# Patient Record
Sex: Male | Born: 1970 | Race: Black or African American | Hispanic: No | Smoking: Never smoker
Health system: Southern US, Community
[De-identification: ages and names within clinical notes are randomized; demographics above are authoritative.]

## PROBLEM LIST (undated history)

## (undated) ENCOUNTER — Ambulatory Visit: Admission: EM | Payer: Self-pay

---

## 2021-06-25 ENCOUNTER — Other Ambulatory Visit: Payer: Self-pay

## 2021-06-25 ENCOUNTER — Encounter: Payer: Self-pay | Admitting: Internal Medicine

## 2021-06-25 ENCOUNTER — Ambulatory Visit
Admission: EM | Admit: 2021-06-25 | Discharge: 2021-06-25 | Disposition: A | Discharge status: Home / Self Care | Payer: Self-pay | Attending: Internal Medicine | Admitting: Internal Medicine

## 2021-06-25 DIAGNOSIS — L0291 Cutaneous abscess, unspecified: Secondary | ICD-10-CM

## 2021-06-25 MED ORDER — DOXYCYCLINE HYCLATE 100 MG PO CAPS: 100.0000 mg | ORAL_CAPSULE | Freq: Two times a day (BID) | ORAL | 0 refills | Status: DC

## 2021-06-25 NOTE — ED Provider Notes (Signed)
[facility]    CSN: [id number] Arrival date & time: 06/25/21  0803      History   Chief Complaint Chief Complaint  Patient presents with   Abscess    HPI Juan Krause is a 51 y.o. male who presents with onset of abscesses starting about one week ago. Has them on both axilla, R groin, lower abdomen and back of his neck which appeared this am. He has never had this before. He tried to drain the one of his abdomen, but nothing came out. This area is the most tender. As far as he knows his male sexual partner of 1 y, does not have  hx of abscesses.      History reviewed. No pertinent past medical history.  There are no problems to display for this patient.   History reviewed. No pertinent surgical history.     Home Medications    Prior to Admission medications   Medication Sig Start Date End Date Taking? Authorizing Provider  doxycycline (VIBRAMYCIN) 100 MG capsule Take 1 capsule (100 mg total) by mouth 2 (two) times daily. 06/25/21  Yes Rodriguez-Southworth, Viviana Simpler    Family History History reviewed. No pertinent family history.  Social History Social History   Tobacco Use   Smoking status: Never   Smokeless tobacco: Never  Vaping Use   Vaping Use: Never used  Substance Use Topics   Alcohol use: Not Currently   Drug use: Not Currently     Allergies   Patient has no known allergies.   Review of Systems Review of Systems  Constitutional:  Negative for fever.  Musculoskeletal:  Negative for myalgias.  Skin:  Negative for rash and wound.       Lumps on skin    Physical Exam Triage Vital Signs ED Triage Vitals  Enc Vitals Group     BP 06/25/21 0818 137/86     Pulse Rate 06/25/21 0818 75     Resp 06/25/21 0818 18     Temp 06/25/21 0818 98.2 F (36.8 C)     Temp Source 06/25/21 0818 Oral     SpO2 06/25/21 0818 98 %     Weight 06/25/21 0816 245 lb (111.1 kg)     Height 06/25/21 0816 6' (1.829 m)     Head Circumference --       Peak Flow --      Pain Score 06/25/21 0816 4     Pain Loc --      Pain Edu? --      Excl. in GC? --    No data found.  Updated Vital Signs BP 137/86 (BP Location: Right Arm)    Pulse 75    Temp 98.2 F (36.8 C) (Oral)    Resp 18    Ht 6' (1.829 m)    Wt 245 lb (111.1 kg)    SpO2 98%    BMI 33.23 kg/m   Visual Acuity Right Eye Distance:   Left Eye Distance:   Bilateral Distance:    Right Eye Near:   Left Eye Near:    Bilateral Near:     Physical Exam Vitals and nursing note reviewed.  Constitutional:      General: He is not in acute distress.    Appearance: He is obese. He is not toxic-appearing.  HENT:     Right Ear: External ear normal.     Left Ear: External ear normal.  Eyes:     General: No scleral icterus.  Conjunctiva/sclera: Conjunctivae normal.  Neck:     Comments: Has induration of about 1/2 cm x 1/2 on central posterior neck. Skin is not red, and area is not tender Pulmonary:     Effort: Pulmonary effort is normal.  Musculoskeletal:     Cervical back: Neck supple.  Skin:    General: Skin is warm and dry.     Findings: No bruising.     Comments: Has indurations in following areas: 1/2 cm x 1/2 cm induration on R axilla, L axilla and central suprapubic region. 1.5 cm x 1.5 cm x 1.5 cm on R upper inner thigh 3 cm x 3 cm on central lower abdomen below umbilicus.   Neurological:     Mental Status: He is alert and oriented to person, place, and time.     Gait: Gait normal.  Psychiatric:        Mood and Affect: Mood normal.        Behavior: Behavior normal.        Thought Content: Thought content normal.        Judgment: Judgment normal.     UC Treatments / Results  Labs (all labs ordered are listed, but only abnormal results are displayed) Labs Reviewed - No data to display  EKG   Radiology No results found.  Procedures Procedures (including critical care time)  Medications Ordered in UC Medications - No data to display  Initial  Impression / Assessment and Plan / UC Course  I have reviewed the triage vital signs and the nursing notes. Abscesses None of them are ready for I&D I placed him on Doxy as noted. See instructions.      Final Clinical Impressions(s) / UC Diagnoses   Final diagnoses:  Abscess     Discharge Instructions      Use heat on those tender areas for 15 minutes, 2-4 times a day  Come back if any of those gets larger and more tender, in case we need to drain it here     ED Prescriptions     Medication Sig Dispense Auth. Provider   doxycycline (VIBRAMYCIN) 100 MG capsule Take 1 capsule (100 mg total) by mouth 2 (two) times daily. 20 capsule Rodriguez-Southworth, Nettie Elm, PA-C      PDMP not reviewed this encounter.   Garey Ham, PA-C 06/25/21 0845

## 2021-06-25 NOTE — Discharge Instructions (Addendum)
Use heat on those tender areas for 15 minutes, 2-4 times a day  Come back if any of those gets larger and more tender, in case we need to drain it here

## 2021-06-25 NOTE — ED Triage Notes (Signed)
Pt has abscesses on bilateral axilla, right groin, lower abdomen and the back of his neck. The first one started about a week ago. He has been using neosporin.

## 2021-08-10 ENCOUNTER — Ambulatory Visit
Admission: EM | Admit: 2021-08-10 | Discharge: 2021-08-10 | Disposition: A | Discharge status: Home / Self Care | Payer: Self-pay | Attending: Physician Assistant | Admitting: Physician Assistant

## 2021-08-10 ENCOUNTER — Other Ambulatory Visit: Payer: Self-pay

## 2021-08-10 DIAGNOSIS — R21 Rash and other nonspecific skin eruption: Secondary | ICD-10-CM

## 2021-08-10 DIAGNOSIS — S39012A Strain of muscle, fascia and tendon of lower back, initial encounter: Secondary | ICD-10-CM

## 2021-08-10 MED ORDER — NAPROXEN 500 MG PO TABS: 500.0000 mg | ORAL_TABLET | Freq: Two times a day (BID) | ORAL | 0 refills | Status: AC

## 2021-08-10 MED ORDER — CLOTRIMAZOLE 1 % EX CREA: TOPICAL_CREAM | CUTANEOUS | 0 refills | Status: AC

## 2021-08-10 MED ORDER — CYCLOBENZAPRINE HCL 10 MG PO TABS: 10.0000 mg | ORAL_TABLET | Freq: Three times a day (TID) | ORAL | 0 refills | Status: AC | PRN

## 2021-08-10 NOTE — ED Triage Notes (Signed)
Pt is here with C/O rash from wearing new uniforms, pt also here with C/O right lower back pain, was working on Monday and bent over and felt something pop has felt like a pull since.  ?

## 2021-08-10 NOTE — Discharge Instructions (Addendum)

## 2021-08-10 NOTE — ED Provider Notes (Signed)
?[facility] ? ? ? ?CSN: [id number] ?Arrival date & time: 08/10/21  1309 ? ? ?  ? ?History   ?Chief Complaint ?Chief Complaint  ?Patient presents with  ? Back Pain  ? Rash  ? ? ?HPI ?Juan Krause is a 51 y.o. male presenting for 2 separate complaints.  First patient states he has had an itchy dry rash in his groin area, lower back and bilateral axillary regions for the past couple of weeks.  He has applied hydrocortisone cream which she says helps the itch but the rash has not gone away.  He believes it may be due to "polyester uniforms" at work.  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 ? ?Patient also presenting for lower back pain x2 days.  Patient states he was bending over to pick something up at work and felt a pop in his back.  He says he is at increased pain when he turns or twists anyway ever since.  Pain does not radiate.  It is aching/cramping in nature.  No numbness, tingling or weakness.  No bowel or bladder incontinence.  Denies similar problems the past.  Has not taken any medication over-the-counter because he says he did not know what to take.  No other concerns. ? ?HPI ? ?History reviewed. No pertinent past medical history. ? ?There are no problems to display for this patient. ? ? ?History reviewed. No pertinent surgical history. ? ? ? ? ?Home Medications   ? ?Prior to Admission medications   ?Medication Sig Start Date End Date Taking? Authorizing Provider  ?clotrimazole (LOTRIMIN) 1 % cream Apply to affected area 2 times daily 08/10/21 08/24/21 Yes Eusebio FriendlyEaves, Rodger Giangregorio B, PA-C  ?cyclobenzaprine (FLEXERIL) 10 MG tablet Take 1 tablet (10 mg total) by mouth 3 (three) times daily as needed for up to 7 days for muscle spasms. 08/10/21 08/17/21 Yes Eusebio FriendlyEaves, Lemma Tetro B, PA-C  ?naproxen (NAPROSYN) 500 MG tablet Take 1 tablet (500 mg total) by mouth 2 (two) times daily for 10 days. 08/10/21 08/20/21 Yes Shirlee LatchEaves, Azlynn Mitnick B, PA-C   ? ? ?Family History ?History reviewed. No pertinent family history. ? ?Social History ?Social History  ? ?Tobacco Use  ? Smoking status: Never  ? Smokeless tobacco: Never  ?Vaping Use  ? Vaping Use: Never used  ?Substance Use Topics  ? Alcohol use: Not Currently  ? Drug use: Not Currently  ? ? ? ?Allergies   ?Patient has no known allergies. ? ? ?Review of Systems ?Review of Systems  ?Constitutional:  Negative for fatigue and fever.  ?Gastrointestinal:  Negative for abdominal pain.  ?Genitourinary:  Negative for dysuria, flank pain and penile discharge.  ?Musculoskeletal:  Positive for back pain. Negative for arthralgias and gait problem.  ?Skin:  Positive for rash.  ?Neurological:  Negative for weakness and numbness.  ? ? ?Physical Exam ?Triage Vital Signs ?ED Triage Vitals  ?Enc Vitals Group  ?   BP   ?   Pulse   ?   Resp   ?   Temp   ?   Temp src   ?   SpO2   ?   Weight   ?   Height   ?   Head Circumference   ?   Peak Flow   ?   Pain Score   ?   Pain Loc   ?   Pain Edu?   ?  Excl. in GC?   ? ?No data found. ? ?Updated Vital Signs ?BP 140/90 (BP Location: Left Arm)   Pulse 66   Temp 98.5 ?F (36.9 ?C) (Oral)   Resp 18   Ht 6' (1.829 m)   Wt 245 lb (111.1 kg)   SpO2 99%   BMI 33.23 kg/m?  ?   ? ?Physical Exam ?Vitals and nursing note reviewed.  ?Constitutional:   ?   General: He is not in acute distress. ?   Appearance: Normal appearance. He is well-developed. He is not ill-appearing.  ?HENT:  ?   Head: Normocephalic and atraumatic.  ?Eyes:  ?   General: No scleral icterus. ?   Conjunctiva/sclera: Conjunctivae normal.  ?Cardiovascular:  ?   Rate and Rhythm: Normal rate and regular rhythm.  ?   Heart sounds: Normal heart sounds.  ?Pulmonary:  ?   Effort: Pulmonary effort is normal. No respiratory distress.  ?   Breath sounds: Normal breath sounds.  ?Musculoskeletal:  ?   Cervical back: Neck supple.  ?   Lumbar back: Tenderness (diffuse TTP bilateral lumbar musculature) present. Decreased range of motion.  Negative right straight leg raise test and negative left straight leg raise test.  ?Skin: ?   General: Skin is warm and dry.  ?   Capillary Refill: Capillary refill takes less than 2 seconds.  ?   Findings: Rash present.  ?   Comments: Dry hyperpigmented rash of lower back, bilateral inguinal regions and bilateral axillary regions.  ?Neurological:  ?   General: No focal deficit present.  ?   Mental Status: He is alert. Mental status is at baseline.  ?   Motor: No weakness.  ?   Coordination: Coordination normal.  ?Psychiatric:     ?   Mood and Affect: Mood normal.     ?   Behavior: Behavior normal.     ?   Thought Content: Thought content normal.  ? ? ? ?UC Treatments / Results  ?Labs ?(all labs ordered are listed, but only abnormal results are displayed) ?Labs Reviewed - No data to display ? ?EKG ? ? ?Radiology ?No results found. ? ?Procedures ?Procedures (including critical care time) ? ?Medications Ordered in UC ?Medications - No data to display ? ?Initial Impression / Assessment and Plan / UC Course  ?I have reviewed the triage vital signs and the nursing notes. ? ?Pertinent labs & imaging results that were available during my care of the patient were reviewed by me and considered in my medical decision making (see chart for details). ? ?51 year old male presenting for 2 separate complaints.  First patient presenting with pruritic rash of groin, lower back and bilateral axillary regions.  Rash is consistent with suspected fungal type rash.  Send clotrimazole to pharmacy.  Advised him not to apply any more hydrocortisone as it is likely making it worse. ? ?Patient also presenting for work-related back pain following an injury.  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  He says he wanted to be checked out first.  Pain is diffusely across the lumbar region.  It does not radiate.  It is not associated with any red flag signs or symptoms.  It is consistent with lumbar strain/sprain.  Patient given  naproxen and cyclobenzaprine.  Reviewed ED red flags relating to back pain with patient.  Work note given. ? ? ?Final Clinical Impressions(s) / UC Diagnoses  ? ?Final diagnoses:  ?Strain of lumbar region, initial encounter  ?Rash  ? ? ? ?  Discharge Instructions   ? ?  ?BACK PAIN: Stressed avoiding painful activities . RICE (REST, ICE, COMPRESSION, ELEVATION) guidelines reviewed. May alternate ice and heat. Consider use of muscle rubs, Salonpas patches, etc. Use medications as directed including muscle relaxers if prescribed. Take anti-inflammatory medications as prescribed or OTC NSAIDs/Tylenol.  F/u with PCP in 7-10 days for reexamination, and please feel free to call or return to the urgent care at any time for any questions or concerns you may have and we will be happy to help you!  ? ?BACK PAIN RED FLAGS: If the back pain acutely worsens or there are any red flag symptoms such as numbness/tingling, leg weakness, saddle anesthesia, or loss of bowel/bladder control, go immediately to the ER. Follow up with [place] as scheduled or sooner if the pain does not begin to resolve or if it worsens before the follow up   ? ? ? ? ?ED Prescriptions   ? ? Medication Sig Dispense Auth. Provider  ? clotrimazole (LOTRIMIN) 1 % cream Apply to affected area 2 times daily 45 g Eusebio Friendly B, PA-C  ? naproxen (NAPROSYN) 500 MG tablet Take 1 tablet (500 mg total) by mouth 2 (two) times daily for 10 days. 20 tablet Eusebio Friendly B, PA-C  ? cyclobenzaprine (FLEXERIL) 10 MG tablet Take 1 tablet (10 mg total) by mouth 3 (three) times daily as needed for up to 7 days for muscle spasms. 15 tablet Shirlee Latch, PA-C  ? ?  ? ?PDMP not reviewed this encounter. ?  ?Shirlee Latch, PA-C ?08/10/21 1504 ? ?

## 2021-08-16 ENCOUNTER — Other Ambulatory Visit: Payer: Self-pay

## 2021-08-16 ENCOUNTER — Emergency Department
Admission: EM | Admit: 2021-08-16 | Discharge: 2021-08-16 | Disposition: A | Discharge status: Home / Self Care | Payer: Worker's Compensation | Attending: Emergency Medicine | Admitting: Emergency Medicine

## 2021-08-16 ENCOUNTER — Emergency Department: Payer: Worker's Compensation

## 2021-08-16 DIAGNOSIS — M545 Low back pain, unspecified: Secondary | ICD-10-CM | POA: Diagnosis present

## 2021-08-16 DIAGNOSIS — M5432 Sciatica, left side: Secondary | ICD-10-CM | POA: Insufficient documentation

## 2021-08-16 DIAGNOSIS — M5441 Lumbago with sciatica, right side: Secondary | ICD-10-CM

## 2021-08-16 DIAGNOSIS — M5431 Sciatica, right side: Secondary | ICD-10-CM | POA: Insufficient documentation

## 2021-08-16 MED ORDER — KETOROLAC TROMETHAMINE 30 MG/ML IJ SOLN
30.0000 mg | Freq: Once | INTRAMUSCULAR | Status: DC
  Filled 2021-08-16: qty 1

## 2021-08-16 MED ORDER — KETOROLAC TROMETHAMINE 30 MG/ML IJ SOLN
30.0000 mg | Freq: Once | INTRAMUSCULAR | Status: AC
  Administered 2021-08-16: 30 mg via INTRAMUSCULAR
  Filled 2021-08-16: qty 1

## 2021-08-16 NOTE — ED Notes (Signed)
Dc instructions reviewed with pt no questions or concerns at this time. Will follow up with ortho. Pt wheeled to waiting area for famiy to transport home ? ?

## 2021-08-16 NOTE — ED Triage Notes (Signed)
Pt c/o lower back pain radiating into the left leg for the past week, states he injured it at work, works at [employer].  ?

## 2021-08-16 NOTE — ED Notes (Signed)
See triage note  presents with lower back pain which is moving into right leg  states he felt a pop in his back last week at work  was seen at [facility]   placed on flexeril and naprosyn  state he thinks pain is getting worse ?

## 2021-08-16 NOTE — ED Provider Notes (Signed)
? ?[facility] ?Provider Note ? ? ? Event Date/Time  ? First MD Initiated Contact with Patient 08/16/21 1341   ?  (approximate) ? ? ?History  ? ?Back Pain ? ? ?HPI ? ?Juan Krause is a 51 y.o. male   to the ED with complaint of low back pain.  Patient states that he was seen initially for his Workmen's Comp. injury at [facility] urgent care on 08/10/2021 at which time he was diagnosed with a strain of the lumbar region.  Patient was prescribed Flexeril 10 mg 3 times daily and naproxen.  He reports that he took the Flexeril prior to coming to the ED but did not take the anti-inflammatory.  He reports that today there is radiation into both his legs but is still able to ambulate without any assistance.  He denies any incontinence of bowel or bladder.  He denies any urinary symptoms or history of kidney stones.  Patient denies any health problems. ? ?  ? ? ?Physical Exam  ? ?Triage Vital Signs: ?ED Triage Vitals  ?Enc Vitals Group  ?   BP 08/16/21 1325 (!) 149/84  ?   Pulse Rate 08/16/21 1325 67  ?   Resp 08/16/21 1325 18  ?   Temp 08/16/21 1325 97.6 ?F (36.4 ?C)  ?   Temp Source 08/16/21 1325 Oral  ?   SpO2 08/16/21 1325 93 %  ?   Weight 08/16/21 1331 244 lb 11.4 oz (111 kg)  ?   Height 08/16/21 1331 6' (1.829 m)  ?   Head Circumference --   ?   Peak Flow --   ?   Pain Score --   ?   Pain Loc --   ?   Pain Edu? --   ?   Excl. in GC? --   ? ? ?Most recent vital signs: ?Vitals:  ? 08/16/21 1325 08/16/21 1530  ?BP: (!) 149/84 140/82  ?Pulse: 67 70  ?Resp: 18 18  ?Temp: 97.6 ?F (36.4 ?C) 97.8 ?F (36.6 ?C)  ?SpO2: 93% 95%  ? ? ? ?General: Awake, no distress.  Lying supine on stretcher. ?CV:  Good peripheral perfusion.  Regular rate and rhythm. ?Resp:  Normal effort.  Lungs are clear bilaterally. ?Abd:  No distention.  ?Other:  On examination of the lower back there is no gross deformity however there is tenderness on palpation of L1 down through the sacral area.  Range of motion is slow and guarded.  Good  muscle strength bilaterally at 5/5.  Straight leg raises are approximately 45 degrees with pain reported in the left anterior thigh.  45 degrees right lower extremity with pain lower back.  Reflexes are 2+ bilaterally. ? ?ED Results / Procedures / Treatments  ? ?Labs ?(all labs ordered are listed, but only abnormal results are displayed) ?Labs Reviewed - No data to display ? ? ? ? ?RADIOLOGY ?Bar spine x-ray was reviewed by myself and no acute bony abnormalities were noted.  Radiology report was read and shows some minimal degenerative changes.  Mild disc space narrowing at L5-S1 area. ? ? ? ?PROCEDURES: ? ?Critical Care performed:  ? ?Procedures ? ? ?MEDICATIONS ORDERED IN ED: ?Medications  ?ketorolac (TORADOL) 30 MG/ML injection 30 mg (30 mg Intramuscular Given 08/16/21 1509)  ? ? ? ?IMPRESSION / MDM / ASSESSMENT AND PLAN / ED COURSE  ?I reviewed the triage vital signs and the nursing notes. ? ? ?Differential diagnosis includes, but is not limited to, lumbar strain, compression  fracture, low back pain with sciatica. ? ?51 year old male presents to the ED with complaint of low back pain with radiation into bilateral lower extremities.  Patient was seen at [facility] urgent care and started on naproxen 5 mg twice daily and Flexeril.  Patient reports today that there is not seem to be any improvement.  Physical exam is consistent with a lumbosacral strain with tenderness lower lumbar paravertebral muscles bilaterally.  Range of motion is slow and guarded secondary to discomfort.  Patient was given Toradol 30 mg IM while in the ED.  X-rays showed some mild degenerative changes and patient was made aware.  He is unaware of any specific doctor that his company uses for [employer]. injuries.  Patient was given the name of Dr. Okey Dupre since he is on-call for orthopedics however if the company he works for has a specific doctor he is aware that he will need to see that doctor instead.  A note for limited duty due to his  Workmen's Comp. injury was written for him. ? ? ?FINAL CLINICAL IMPRESSION(S) / ED DIAGNOSES  ? ?Final diagnoses:  ?Acute bilateral low back pain with bilateral sciatica  ? ? ? ?Rx / DC Orders  ? ?ED Discharge Orders   ? ? None  ? ?  ? ? ? ?Note:  This document was prepared using Dragon voice recognition software and may include unintentional dictation errors. ?  ?Tommi Rumps, PA-C ?08/16/21 1531 ? ?  ?Jene Every, MD ?08/19/21 [phone] ? ?

## 2021-08-16 NOTE — Discharge Instructions (Addendum)
Follow-up with your companies doctor or call and make an appoint with Dr. Okey Dupre who is on-call for orthopedics if you continue to have problems with your back.  May use ice or heat to your back as needed for discomfort.  A work note with restrictions was written for you to take to your work.  Continue taking the naproxen 500 mg twice daily with food every day.  Take the muscle relaxant only at bedtime. ?

## 2021-09-30 ENCOUNTER — Ambulatory Visit
Admission: EM | Admit: 2021-09-30 | Discharge: 2021-09-30 | Disposition: A | Discharge status: Home / Self Care | Payer: Self-pay | Attending: Physician Assistant | Admitting: Physician Assistant

## 2021-09-30 DIAGNOSIS — H579 Unspecified disorder of eye and adnexa: Secondary | ICD-10-CM

## 2021-09-30 NOTE — Discharge Instructions (Addendum)
Attempted multiple eye washes and advised we, unable to remove foreign body ? ?It is possible that there is nothing in your eye at this time and your eye was scratched, typically scratches to the eye will fix your cells within a 24-hour.  Without complication ? ?You may continue to rinse the eye at home ? ?May use cool compresses, Tylenol and ibuprofen for comfort ? ?If you do not start to see any improvement in your symptoms by noon tomorrow please reach out to the ophthalmologist for further evaluation, information listed below ?

## 2021-09-30 NOTE — ED Provider Notes (Signed)
?[facility] ? ? ? ?CSN: [id number] ?Arrival date & time: 09/30/21  1906 ? ? ?  ? ?History   ?Chief Complaint ?Chief Complaint  ?Patient presents with  ? Foreign Body in Eye  ? ? ?HPI ?Griffen Frayne is a 51 y.o. male.  ? ?Patient presents with a sensation of foreign body to the right eye and underneath the upper eyelid occurring today.  Endorses that he was grinding an object on his car when he removed his protective shields and something flew into his eye.  Has attempted eye washing at home which has been unsuccessful.  Denies eye pain, redness, itching, blurred vision or light sensitivity.  Does not wear contacts or glasses. ? ?No past medical history on file. ? ?There are no problems to display for this patient. ? ? ?No past surgical history on file. ? ? ? ? ?Home Medications   ? ?Prior to Admission medications   ?Not on File  ? ? ?Family History ?No family history on file. ? ?Social History ?Social History  ? ?Tobacco Use  ? Smoking status: Never  ? Smokeless tobacco: Never  ?Vaping Use  ? Vaping Use: Never used  ?Substance Use Topics  ? Alcohol use: Not Currently  ? Drug use: Not Currently  ? ? ? ?Allergies   ?Patient has no known allergies. ? ? ?Review of Systems ?Review of Systems  ?Constitutional: Negative.   ?HENT: Negative.    ?Eyes: Negative.   ?Respiratory: Negative.    ?Cardiovascular: Negative.   ?Gastrointestinal: Negative.   ? ? ?Physical Exam ?Triage Vital Signs ?ED Triage Vitals  ?Enc Vitals Group  ?   BP 09/30/21 1924 140/90  ?   Pulse Rate 09/30/21 1924 62  ?   Resp 09/30/21 1924 18  ?   Temp 09/30/21 1924 98.8 ?F (37.1 ?C)  ?   Temp Source 09/30/21 1924 Oral  ?   SpO2 09/30/21 1924 99 %  ?   Weight 09/30/21 1920 245 lb (111.1 kg)  ?   Height 09/30/21 1920 6' (1.829 m)  ?   Head Circumference --   ?   Peak Flow --   ?   Pain Score 09/30/21 1924 0  ?   Pain Loc --   ?   Pain Edu? --   ?   Excl. in GC? --   ? ?No data found. ? ?Updated Vital Signs ?BP 140/90 (BP Location: Left Arm)    Pulse 62   Temp 98.8 ?F (37.1 ?C) (Oral)   Resp 18   Ht 6' (1.829 m)   Wt 245 lb (111.1 kg)   SpO2 99%   BMI 33.23 kg/m?  ? ?Visual Acuity ?Right Eye Distance: 20/25 un corrected ?Left Eye Distance: 20/25 un corrected ?Bilateral Distance: 20/25 un corrected ? ?Right Eye Near:   ?Left Eye Near:    ?Bilateral Near:    ? ?Physical Exam ?Constitutional:   ?   Appearance: Normal appearance.  ?HENT:  ?   Head: Normocephalic.  ?Eyes:  ?   Comments: Right sclera mildly erythematous, nontender, nondraining, vision grossly intact, extraocular movements intact  ?Neurological:  ?   Mental Status: He is alert and oriented to person, place, and time. Mental status is at baseline.  ?Psychiatric:     ?   Mood and Affect: Mood normal.     ?   Behavior: Behavior normal.  ? ? ? ?UC Treatments / Results  ?Labs ?(all labs ordered are listed, but only abnormal  results are displayed) ?Labs Reviewed - No data to display ? ?EKG ? ? ?Radiology ?No results found. ? ?Procedures ?Procedures (including critical care time) ? ?Medications Ordered in UC ?Medications - No data to display ? ?Initial Impression / Assessment and Plan / UC Course  ?I have reviewed the triage vital signs and the nursing notes. ? ?Pertinent labs & imaging results that were available during my care of the patient were reviewed by me and considered in my medical decision making (see chart for details). ? ?Sensation of foreign body in eye ? ?Attempted eyewash and sweep, unsuccessful, possible that there is no foreign body in the eye and patient has a corneal abrasion, discussed with patient, recommended a watchful wait, may continue eye washing at home, cool compresses for comfort, Tylenol and ibuprofen for pain, given walking referral to ophthalmology and if no improvement seen by midday tomorrow patient is to follow-up ?Final Clinical Impressions(s) / UC Diagnoses  ? ?Final diagnoses:  ?None  ? ?Discharge Instructions   ?None ?  ? ?ED Prescriptions   ?None ?  ? ?PDMP  not reviewed this encounter. ?  ?Valinda Hoar, NP ?10/01/21 0820 ? ?

## 2021-09-30 NOTE — ED Triage Notes (Signed)
Pt here with C/O something in right eye maybe, was grinding something and felt something go in right eye, did wash it out with water but still feels like something is in there.  ?

## 2022-08-15 ENCOUNTER — Other Ambulatory Visit: Payer: Self-pay

## 2022-08-15 ENCOUNTER — Ambulatory Visit
Admission: EM | Admit: 2022-08-15 | Discharge: 2022-08-15 | Disposition: A | Discharge status: Home / Self Care | Payer: Managed Care, Other (non HMO) | Attending: Family | Admitting: Family

## 2022-08-15 ENCOUNTER — Encounter: Payer: Self-pay | Admitting: *Deleted

## 2022-08-15 DIAGNOSIS — R10815 Periumbilic abdominal tenderness: Secondary | ICD-10-CM

## 2022-08-15 DIAGNOSIS — R1033 Periumbilical pain: Secondary | ICD-10-CM | POA: Diagnosis not present

## 2022-08-15 DIAGNOSIS — M545 Low back pain, unspecified: Secondary | ICD-10-CM

## 2022-08-15 MED ORDER — PREDNISONE 10 MG (21) PO TBPK: ORAL_TABLET | Freq: Every day | ORAL | 0 refills | Status: DC

## 2022-08-15 MED ORDER — CYCLOBENZAPRINE HCL 10 MG PO TABS: 10.0000 mg | ORAL_TABLET | Freq: Every day | ORAL | 0 refills | Status: DC

## 2022-08-15 NOTE — ED Provider Notes (Signed)
[facility]    CSN: [id number] Arrival date & time: 08/15/22  0948      History   Chief Complaint Chief Complaint  Patient presents with   Back Pain   Hernia    HPI Juan Krause is a 52 y.o. male.   52 year old male presents with 2 concerns. 1st concern is he is experiencing intermittent sharp central abdominal pain with any coughing/straining. He has noticed and feels more "pressure" around his umbilicus for the past 2 to 3 months. Concerned over possible hernia. Pain will get worse with lifting and pain will travel down to inguinal area, mainly on left side. Denies any nausea, vomiting, diarrhea or upper abdominal pain.  2nd concern is recurrent lower back pain. He has been experiencing bilateral lower back pain that occasionally travels up to thoracic area for the past 2 weeks. Denies any radiation of pain down his legs or any numbness. No change in bowel or bladder control. No weakness. Has had back pain in the past and has been evaluated at Urgent Care about 1 year ago and prescribed NSAIDs and Flexeril with some success. Wants to know cause of back pain. No current PCP. Requests local specialist, not willing to travel to [place] or [place] at this time.   The history is provided by the patient.  Back Pain   History reviewed. No pertinent past medical history.  There are no problems to display for this patient.   History reviewed. No pertinent surgical history.     Home Medications    Prior to Admission medications   Medication Sig Start Date End Date Taking? Authorizing Provider  cyclobenzaprine (FLEXERIL) 10 MG tablet Take 1 tablet (10 mg total) by mouth at bedtime. 08/15/22  Yes Kelyn Ponciano, Nicholes Stairs, NP  predniSONE (STERAPRED UNI-PAK 21 TAB) 10 MG (21) TBPK tablet Take by mouth daily. Take 6 tabs by mouth on day 1 then decrease by 1 tablet each day until finished on day 6. 08/15/22  Yes Jaeven Wanzer, Nicholes Stairs, NP    Family History History reviewed.  No pertinent family history.  Social History Social History   Tobacco Use   Smoking status: Never   Smokeless tobacco: Never  Vaping Use   Vaping Use: Never used  Substance Use Topics   Alcohol use: Not Currently   Drug use: Not Currently     Allergies   Patient has no known allergies.   Review of Systems Review of Systems  Musculoskeletal:  Positive for back pain.     Physical Exam Triage Vital Signs ED Triage Vitals  Enc Vitals Group     BP 08/15/22 1124 (!) 145/95     Pulse Rate 08/15/22 1124 (!) 53     Resp 08/15/22 1124 18     Temp 08/15/22 1124 98.1 F (36.7 C)     Temp src --      SpO2 08/15/22 1124 100 %     Weight --      Height --      Head Circumference --      Peak Flow --      Pain Score 08/15/22 1122 8     Pain Loc --      Pain Edu? --      Excl. in [place]? --    No data found.  Updated Vital Signs BP (!) 145/95   Pulse (!) 53   Temp 98.1 F (36.7 C)   Resp 18   SpO2 100%  Visual Acuity Right Eye Distance:   Left Eye Distance:   Bilateral Distance:    Right Eye Near:   Left Eye Near:    Bilateral Near:     Physical Exam   UC Treatments / Results  Labs (all labs ordered are listed, but only abnormal results are displayed) Labs Reviewed - No data to display  EKG   Radiology No results found.  Procedures Procedures (including critical care time)  Medications Ordered in UC Medications - No data to display  Initial Impression / Assessment and Plan / UC Course  I have reviewed the triage vital signs and the nursing notes.  Pertinent labs & imaging results that were available during my care of the patient were reviewed by me and considered in my medical decision making (see chart for details).     *** Final Clinical Impressions(s) / UC Diagnoses   Final diagnoses:  Acute bilateral low back pain without sciatica  Umbilical pain     Discharge Instructions      Recommend start Prednisone '10mg'$  tablets- take 6  tablets with food today then decrease by 1 tablet each day until finished on day 6. May take Flexeril '10mg'$  muscle relaxer at night to help with back pain/spasms. May apply warm compresses to back area as needed for comfort. Recommend contact [facility] for further evaluation of recurrent back pain.  Recommend you contact [facility] to schedule appointment for evaluation of probable umbilical hernia.     ED Prescriptions     Medication Sig Dispense Auth. Provider   predniSONE (STERAPRED UNI-PAK 21 TAB) 10 MG (21) TBPK tablet Take by mouth daily. Take 6 tabs by mouth on day 1 then decrease by 1 tablet each day until finished on day 6. 21 tablet Seretha Estabrooks, Nicholes Stairs, NP   cyclobenzaprine (FLEXERIL) 10 MG tablet Take 1 tablet (10 mg total) by mouth at bedtime. 10 tablet Tashi Andujo, Nicholes Stairs, NP      PDMP not reviewed this encounter.

## 2022-08-15 NOTE — Discharge Instructions (Addendum)
Recommend start Prednisone '10mg'$  tablets- take 6 tablets with food today then decrease by 1 tablet each day until finished on day 6. May take Flexeril '10mg'$  muscle relaxer at night to help with back pain/spasms. May apply warm compresses to back area as needed for comfort. Recommend contact [facility] for further evaluation of recurrent back pain.  Recommend you contact [facility] to schedule appointment for evaluation of probable umbilical hernia.

## 2022-08-15 NOTE — ED Triage Notes (Signed)
Pt reports he has an ABD hernia and is having sharpe pains. Pt has also had back pain located in mid area and lower part of back for 14 days the patient has tried ibuprofen with out relief.

## 2022-10-15 ENCOUNTER — Emergency Department
Admission: EM | Admit: 2022-10-15 | Discharge: 2022-10-15 | Disposition: A | Discharge status: Home / Self Care | Payer: Managed Care, Other (non HMO) | Attending: Emergency Medicine | Admitting: Emergency Medicine

## 2022-10-15 ENCOUNTER — Other Ambulatory Visit: Payer: Self-pay

## 2022-10-15 ENCOUNTER — Emergency Department: Payer: Managed Care, Other (non HMO)

## 2022-10-15 DIAGNOSIS — Z1152 Encounter for screening for COVID-19: Secondary | ICD-10-CM | POA: Diagnosis not present

## 2022-10-15 DIAGNOSIS — B349 Viral infection, unspecified: Secondary | ICD-10-CM | POA: Insufficient documentation

## 2022-10-15 DIAGNOSIS — R0602 Shortness of breath: Secondary | ICD-10-CM | POA: Diagnosis present

## 2022-10-15 LAB — COMPREHENSIVE METABOLIC PANEL
ALT: 22 U/L (ref 0–44)
AST: 28 U/L (ref 15–41)
Albumin: 4 g/dL (ref 3.5–5.0)
Alkaline Phosphatase: 82 U/L (ref 38–126)
Anion gap: 10 (ref 5–15)
BUN: 13 mg/dL (ref 6–20)
CO2: 20 mmol/L — ABNORMAL LOW (ref 22–32)
Calcium: 8.6 mg/dL — ABNORMAL LOW (ref 8.9–10.3)
Chloride: 104 mmol/L (ref 98–111)
Creatinine, Ser: 1.12 mg/dL (ref 0.61–1.24)
GFR, Estimated: >60 mL/min (ref >60)
Glucose, Bld: 137 mg/dL — ABNORMAL HIGH (ref 70–99)
Potassium: 3.5 mmol/L (ref 3.5–5.1)
Sodium: 134 mmol/L — ABNORMAL LOW (ref 135–145)
Total Bilirubin: 0.8 mg/dL (ref 0.3–1.2)
Total Protein: 7.2 g/dL (ref 6.5–8.1)

## 2022-10-15 LAB — CBC WITH DIFFERENTIAL/PLATELET
Abs Immature Granulocytes: 0.02 10*3/uL (ref 0.00–0.07)
Basophils Absolute: 0 10*3/uL (ref 0.0–0.1)
Basophils Relative: 1 %
Eosinophils Absolute: 0.1 10*3/uL (ref 0.0–0.5)
Eosinophils Relative: 3 %
HCT: 41.8 % (ref 39.0–52.0)
Hemoglobin: 13.7 g/dL (ref 13.0–17.0)
Immature Granulocytes: 0 %
Lymphocytes Relative: 29 %
Lymphs Abs: 1.4 10*3/uL (ref 0.7–4.0)
MCH: 27.5 pg (ref 26.0–34.0)
MCHC: 32.8 g/dL (ref 30.0–36.0)
MCV: 83.8 fL (ref 80.0–100.0)
Monocytes Absolute: 0.7 10*3/uL (ref 0.1–1.0)
Monocytes Relative: 14 %
Neutro Abs: 2.6 10*3/uL (ref 1.7–7.7)
Neutrophils Relative %: 53 %
Platelets: 232 10*3/uL (ref 150–400)
RBC: 4.99 MIL/uL (ref 4.22–5.81)
RDW: 14.1 % (ref 11.5–15.5)
WBC: 4.9 10*3/uL (ref 4.0–10.5)
nRBC: 0 % (ref 0.0–0.2)

## 2022-10-15 LAB — TROPONIN I (HIGH SENSITIVITY): Troponin I (High Sensitivity): 11 ng/L (ref <18)

## 2022-10-15 LAB — SARS CORONAVIRUS 2 BY RT PCR: SARS Coronavirus 2 by RT PCR: NEGATIVE

## 2022-10-15 MED ORDER — ALBUTEROL SULFATE HFA 108 (90 BASE) MCG/ACT IN AERS: 2.0000 | INHALATION_SPRAY | Freq: Four times a day (QID) | RESPIRATORY_TRACT | 2 refills | Status: DC | PRN

## 2022-10-15 MED ORDER — ACETAMINOPHEN 325 MG PO TABS
650.0000 mg | ORAL_TABLET | Freq: Once | ORAL | Status: AC | PRN
  Administered 2022-10-15: 650 mg via ORAL
  Filled 2022-10-15: qty 2

## 2022-10-15 MED ORDER — IPRATROPIUM-ALBUTEROL 0.5-2.5 (3) MG/3ML IN SOLN
3.0000 mL | Freq: Once | RESPIRATORY_TRACT | Status: AC
  Administered 2022-10-15: 3 mL via RESPIRATORY_TRACT
  Filled 2022-10-15: qty 3

## 2022-10-15 MED ORDER — ALBUTEROL SULFATE HFA 108 (90 BASE) MCG/ACT IN AERS: 2.0000 | INHALATION_SPRAY | RESPIRATORY_TRACT | Status: DC | PRN

## 2022-10-15 NOTE — ED Provider Notes (Signed)
[facility] Provider Note    Event Date/Time   First MD Initiated Contact with Patient 10/15/22 2053     (approximate)   History   flu symptoms    HPI  Juan Krause is a 52 y.o. male  who presents to the emergency department today because of concern for shortness of breath, body aches, chills. Symptoms have been present for the past few days. His son had similar symptoms prior to the patient contracting them. The patient denies any preexisting lung disease. The patient denies any recent travel.     Physical Exam   Triage Vital Signs: ED Triage Vitals  Enc Vitals Group     BP 10/15/22 2021 (!) 145/92     Pulse Rate 10/15/22 2021 79     Resp 10/15/22 2021 (!) 21     Temp 10/15/22 2021 100 F (37.8 C)     Temp Source 10/15/22 2021 Oral     SpO2 10/15/22 2021 95 %     Weight 10/15/22 2018 230 lb (104.3 kg)     Height 10/15/22 2018 5\' 9"  (1.753 m)     Head Circumference --      Peak Flow --      Pain Score 10/15/22 2017 3     Pain Loc --      Pain Edu? --      Excl. in [place]? --     Most recent vital signs: Vitals:   10/15/22 2021  BP: (!) 145/92  Pulse: 79  Resp: (!) 21  Temp: 100 F (37.8 C)  SpO2: 95%   General: Awake, alert, oriented. CV:  Good peripheral perfusion. Regular rate and rhythm. Resp:  Normal effort. Lungs clear. Abd:  No distention. Non tender.   ED Results / Procedures / Treatments   Labs (all labs ordered are listed, but only abnormal results are displayed) Labs Reviewed  COMPREHENSIVE METABOLIC PANEL - Abnormal; Notable for the following components:      Result Value   Sodium 134 (*)    CO2 20 (*)    Glucose, Bld 137 (*)    Calcium 8.6 (*)    All other components within normal limits  SARS CORONAVIRUS 2 BY RT PCR  CBC WITH DIFFERENTIAL/PLATELET  TROPONIN I (HIGH SENSITIVITY)  TROPONIN I (HIGH SENSITIVITY)     EKG  I, Phineas Semen, attending physician, personally viewed and interpreted this  EKG  EKG Time: 2026 Rate: 70 Rhythm: normal sinus rhythm Axis: normal Intervals: qtc 386 QRS: narrow ST changes: no st elevation Impression: normal ekg   RADIOLOGY I independently interpreted and visualized the CXR. My interpretation: No pneumonia Radiology interpretation:  IMPRESSION:  No evidence of active disease.           PROCEDURES:  Critical Care performed: No   MEDICATIONS ORDERED IN ED: Medications  albuterol (VENTOLIN HFA) 108 (90 Base) MCG/ACT inhaler 2 puff (has no administration in time range)  acetaminophen (TYLENOL) tablet 650 mg (650 mg Oral Given 10/15/22 2031)     IMPRESSION / MDM / ASSESSMENT AND PLAN / ED COURSE  I reviewed the triage vital signs and the nursing notes.                              Differential diagnosis includes, but is not limited to, covid, cold, pneumonia  Patient's presentation is most consistent with acute presentation with potential threat to life or bodily function.  Patient presented to the emergency department today with viral URI type symptoms.  Chest x-ray did not show any concerning pneumonias.  Patient was given duoneb treatment and stated he felt improved. COVID was negative. At this time do think likely viral URI. WIll plan on discharging with prescription for albuterol inhaler.  FINAL CLINICAL IMPRESSION(S) / ED DIAGNOSES   Final diagnoses:  Viral illness     Note:  This document was prepared using Dragon voice recognition software and may include unintentional dictation errors.    Phineas Semen, MD 10/15/22 2221

## 2022-10-15 NOTE — Discharge Instructions (Signed)
Please seek medical attention for any high fevers, chest pain, shortness of breath, change in behavior, persistent vomiting, bloody stool or any other new or concerning symptoms.  

## 2022-10-15 NOTE — ED Notes (Signed)
Patient transported to X-ray 

## 2022-10-15 NOTE — ED Triage Notes (Addendum)
Reports cough, congestion, SOB, and body aches x 2 days. Reports chest pain when he coughs. Denies fevers. Pt ambulatory to triage with N95 and portable pulse ox. Denies hx of asthma or COPD. Pt breathing unlabored with symmetric chest rise and fall.

## 2023-03-24 ENCOUNTER — Ambulatory Visit
Admission: EM | Admit: 2023-03-24 | Discharge: 2023-03-24 | Disposition: A | Discharge status: Home / Self Care | Payer: Managed Care, Other (non HMO) | Attending: Physician Assistant | Admitting: Physician Assistant

## 2023-03-24 DIAGNOSIS — K0889 Other specified disorders of teeth and supporting structures: Secondary | ICD-10-CM

## 2023-03-24 MED ORDER — KETOROLAC TROMETHAMINE 30 MG/ML IJ SOLN
30.0000 mg | Freq: Once | INTRAMUSCULAR | Status: AC
  Administered 2023-03-24: 30 mg via INTRAMUSCULAR

## 2023-03-24 MED ORDER — AMOXICILLIN-POT CLAVULANATE 875-125 MG PO TABS: 1.0000 | ORAL_TABLET | Freq: Two times a day (BID) | ORAL | 0 refills | Status: DC

## 2023-03-24 NOTE — ED Triage Notes (Signed)
Dental pain on the lower left jaw. X 1 week. IBU this morning. Hasn't gave much relief.

## 2023-03-24 NOTE — ED Provider Notes (Signed)
[facility]    CSN: [id number] Arrival date & time: 03/24/23  1308      History   Chief Complaint Chief Complaint  Patient presents with   Dental Problem    HPI Juan Krause is a 52 y.o. male.   Patient presents with left lower back molar dental pain that started about 1 week ago.  He reports pain is radiating to his left jaw and face.  He denies facial swelling, fever, chills.  He has tried ibuprofen with minimal relief.  He does not currently have a dentist.  Denies broken tooth or injury.    History reviewed. No pertinent past medical history.  There are no problems to display for this patient.   History reviewed. No pertinent surgical history.     Home Medications    Prior to Admission medications   Medication Sig Start Date End Date Taking? Authorizing Provider  amoxicillin-clavulanate (AUGMENTIN) 875-125 MG tablet Take 1 tablet by mouth every 12 (twelve) hours. 03/24/23  Yes Ward, Tylene Fantasia, PA-C  albuterol (VENTOLIN HFA) 108 (90 Base) MCG/ACT inhaler Inhale 2 puffs into the lungs every 6 (six) hours as needed for wheezing or shortness of breath. 10/15/22   Phineas Semen, MD  cyclobenzaprine (FLEXERIL) 10 MG tablet Take 1 tablet (10 mg total) by mouth at bedtime. 08/15/22   Sudie Grumbling, NP  predniSONE (STERAPRED UNI-PAK 21 TAB) 10 MG (21) TBPK tablet Take by mouth daily. Take 6 tabs by mouth on day 1 then decrease by 1 tablet each day until finished on day 6. 08/15/22   Amyot, Ali Lowe, NP    Family History History reviewed. No pertinent family history.  Social History Social History   Tobacco Use   Smoking status: Never   Smokeless tobacco: Never  Vaping Use   Vaping status: Never Used  Substance Use Topics   Alcohol use: Not Currently   Drug use: Not Currently     Allergies   Patient has no known allergies.   Review of Systems Review of Systems  Constitutional:  Negative for chills and fever.  HENT:  Positive for dental  problem. Negative for ear pain and sore throat.   Eyes:  Negative for pain and visual disturbance.  Respiratory:  Negative for cough and shortness of breath.   Cardiovascular:  Negative for chest pain and palpitations.  Gastrointestinal:  Negative for abdominal pain and vomiting.  Genitourinary:  Negative for dysuria and hematuria.  Musculoskeletal:  Negative for arthralgias and back pain.  Skin:  Negative for color change and rash.  Neurological:  Negative for seizures and syncope.  All other systems reviewed and are negative.    Physical Exam Triage Vital Signs ED Triage Vitals  Encounter Vitals Group     BP 03/24/23 0842 (!) 159/99     Systolic BP Percentile --      Diastolic BP Percentile --      Pulse Rate 03/24/23 0842 61     Resp 03/24/23 0842 17     Temp 03/24/23 0842 98.1 F (36.7 C)     Temp Source 03/24/23 0842 Oral     SpO2 03/24/23 0842 96 %     Weight --      Height --      Head Circumference --      Peak Flow --      Pain Score 03/24/23 0841 10     Pain Loc --      Pain Education --  Exclude from Growth Chart --    No data found.  Updated Vital Signs BP (!) 159/99 (BP Location: Right Arm)   Pulse 61   Temp 98.1 F (36.7 C) (Oral)   Resp 17   SpO2 96%   Visual Acuity Right Eye Distance:   Left Eye Distance:   Bilateral Distance:    Right Eye Near:   Left Eye Near:    Bilateral Near:     Physical Exam Vitals and nursing note reviewed.  Constitutional:      General: He is not in acute distress.    Appearance: He is well-developed.  HENT:     Head: Normocephalic and atraumatic.     Mouth/Throat:      Comments: Poor dentition Eyes:     Conjunctiva/sclera: Conjunctivae normal.  Cardiovascular:     Rate and Rhythm: Normal rate and regular rhythm.     Heart sounds: No murmur heard. Pulmonary:     Effort: Pulmonary effort is normal. No respiratory distress.     Breath sounds: Normal breath sounds.  Abdominal:     Palpations: Abdomen  is soft.     Tenderness: There is no abdominal tenderness.  Musculoskeletal:        General: No swelling.     Cervical back: Neck supple.  Skin:    General: Skin is warm and dry.     Capillary Refill: Capillary refill takes less than 2 seconds.  Neurological:     Mental Status: He is alert.  Psychiatric:        Mood and Affect: Mood normal.      UC Treatments / Results  Labs (all labs ordered are listed, but only abnormal results are displayed) Labs Reviewed - No data to display  EKG   Radiology No results found.  Procedures Procedures (including critical care time)  Medications Ordered in UC Medications  ketorolac (TORADOL) 30 MG/ML injection 30 mg (30 mg Intramuscular Given 03/24/23 0859)    Initial Impression / Assessment and Plan / UC Course  I have reviewed the triage vital signs and the nursing notes.  Pertinent labs & imaging results that were available during my care of the patient were reviewed by me and considered in my medical decision making (see chart for details).     Dental pain, will cover with antibiotic in case of dental infection.  Toradol given in clinic today.  Supportive care discussed. Advised to follow up with a dentist as soon as possible.  Final Clinical Impressions(s) / UC Diagnoses   Final diagnoses:  Pain, dental     Discharge Instructions      Take antibiotic as prescribed Recommend cold compress Can continue with Tylenol or Ibuprofen as needed Follow up with Dentist    ED Prescriptions     Medication Sig Dispense Auth. Provider   amoxicillin-clavulanate (AUGMENTIN) 875-125 MG tablet Take 1 tablet by mouth every 12 (twelve) hours. 14 tablet Ward, Tylene Fantasia, PA-C      PDMP not reviewed this encounter.   Ward, Tylene Fantasia, PA-C 03/24/23 [id number]

## 2023-03-24 NOTE — Discharge Instructions (Addendum)
Take antibiotic as prescribed Recommend cold compress Can continue with Tylenol or Ibuprofen as needed Follow up with Dentist

## 2023-04-27 ENCOUNTER — Ambulatory Visit: Payer: Managed Care, Other (non HMO) | Admitting: Family Medicine

## 2023-04-27 NOTE — Progress Notes (Deleted)
New patient visit   Patient: Juan Krause   DOB: August 24, 1970   52 y.o. Male  MRN: [id number] Visit Date: 04/27/2023  Today's healthcare provider: Sherlyn Hay, DO   No chief complaint on file.  Subjective    Juan Krause is a 52 y.o. male who presents today as a new patient to establish care.  HPI  No precharting information available  ***  No past medical history on file. No past surgical history on file. No family status information on file.   No family history on file. Social History   Socioeconomic History   Marital status: Divorced    Spouse name: Not on file   Number of children: Not on file   Years of education: Not on file   Highest education level: Not on file  Occupational History   Not on file  Tobacco Use   Smoking status: Never   Smokeless tobacco: Never  Vaping Use   Vaping status: Never Used  Substance and Sexual Activity   Alcohol use: Not Currently   Drug use: Not Currently   Sexual activity: Not on file  Other Topics Concern   Not on file  Social History Narrative   Not on file   Social Determinants of Health   Financial Resource Strain: Not on file  Food Insecurity: Not on file  Transportation Needs: Not on file  Physical Activity: Not on file  Stress: Not on file  Social Connections: Unknown (09/11/2022)   [facility]   Social Network    Social Network: Not on file   Outpatient Medications Prior to Visit  Medication Sig   albuterol (VENTOLIN HFA) 108 (90 Base) MCG/ACT inhaler Inhale 2 puffs into the lungs every 6 (six) hours as needed for wheezing or shortness of breath.   amoxicillin-clavulanate (AUGMENTIN) 875-125 MG tablet Take 1 tablet by mouth every 12 (twelve) hours.   cyclobenzaprine (FLEXERIL) 10 MG tablet Take 1 tablet (10 mg total) by mouth at bedtime.   predniSONE (STERAPRED UNI-PAK 21 TAB) 10 MG (21) TBPK tablet Take by mouth daily. Take 6 tabs by mouth on day 1 then decrease by 1  tablet each day until finished on day 6.   No facility-administered medications prior to visit.   No Known Allergies   There is no immunization history on file for this patient.  Health Maintenance  Topic Date Due   HIV Screening  Never done   Hepatitis C Screening  Never done   DTaP/Tdap/Td (1 - Tdap) Never done   Colonoscopy  Never done   Zoster Vaccines- Shingrix (1 of 2) Never done   INFLUENZA VACCINE  Never done   COVID-19 Vaccine (1 - 2023-24 season) Never done   HPV VACCINES  Aged Out    Patient Care Team: Pcp, No as PCP - General  Review of Systems  {Insert previous labs (optional):23779} {See past labs  Heme  Chem  Endocrine  Serology  Results Review (optional):1}   Objective    There were no vitals taken for this visit. {Insert last BP/Wt (optional):23777}{See vitals history (optional):1}   Physical Exam   Depression Screen     No data to display         No results found for any visits on 04/27/23.  Assessment & Plan     There are no diagnoses linked to this encounter.   ***  No follow-ups on file.     I discussed the assessment and treatment  plan with the patient  The patient was provided an opportunity to ask questions and all were answered. The patient agreed with the plan and demonstrated an understanding of the instructions.   The patient was advised to call back or seek an in-person evaluation if the symptoms worsen or if the condition fails to improve as anticipated.    Sherlyn Hay, DO  [facility] [phone] (phone) [phone] (fax)  [facility]

## 2023-04-30 ENCOUNTER — Encounter: Payer: Self-pay | Admitting: Family Medicine

## 2023-04-30 ENCOUNTER — Telehealth: Payer: Self-pay | Admitting: Family Medicine

## 2023-04-30 NOTE — Telephone Encounter (Signed)
Called to see if patient wants to reschedule missed appointment. No answer & letter will be sent to address on file.

## 2023-07-03 ENCOUNTER — Ambulatory Visit
Admission: EM | Admit: 2023-07-03 | Discharge: 2023-07-03 | Disposition: A | Discharge status: Home / Self Care | Payer: Self-pay | Attending: Physician Assistant | Admitting: Physician Assistant

## 2023-07-03 DIAGNOSIS — K0889 Other specified disorders of teeth and supporting structures: Secondary | ICD-10-CM

## 2023-07-03 DIAGNOSIS — K047 Periapical abscess without sinus: Secondary | ICD-10-CM

## 2023-07-03 MED ORDER — KETOROLAC TROMETHAMINE 30 MG/ML IJ SOLN
30.0000 mg | Freq: Once | INTRAMUSCULAR | Status: AC
  Administered 2023-07-03: 30 mg via INTRAMUSCULAR

## 2023-07-03 MED ORDER — AMOXICILLIN-POT CLAVULANATE 875-125 MG PO TABS: 1.0000 | ORAL_TABLET | Freq: Two times a day (BID) | ORAL | 0 refills | Status: AC

## 2023-07-03 MED ORDER — KETOROLAC TROMETHAMINE 10 MG PO TABS: 10.0000 mg | ORAL_TABLET | Freq: Four times a day (QID) | ORAL | 0 refills | Status: AC | PRN

## 2023-07-03 NOTE — ED Provider Notes (Signed)
[facility]    CSN: [id number] Arrival date & time: 07/03/23  1354      History   Chief Complaint Chief Complaint  Patient presents with   Dental Pain    HPI Juan Krause is a 53 y.o. male presenting for pain of the left third molar on the lower side for the past week.  He reports pain is intermittent and worse when he lays down.  Has been taking ibuprofen with the last dose being this morning.  States it has not really helped.  He does not currently have a dentist but plans to make an appointment to see one.  He has not had any fever or facial swelling.  HPI  History reviewed. No pertinent past medical history.  There are no active problems to display for this patient.   History reviewed. No pertinent surgical history.     Home Medications    Prior to Admission medications   Medication Sig Start Date End Date Taking? Authorizing Provider  amoxicillin-clavulanate (AUGMENTIN) 875-125 MG tablet Take 1 tablet by mouth every 12 (twelve) hours for 7 days. 07/03/23 07/10/23 Yes Eusebio Friendly B, PA-C  ketorolac (TORADOL) 10 MG tablet Take 1 tablet (10 mg total) by mouth every 6 (six) hours as needed. 07/03/23  Yes Shirlee Latch PA-C    Family History History reviewed. No pertinent family history.  Social History Social History   Tobacco Use   Smoking status: Never   Smokeless tobacco: Never  Vaping Use   Vaping status: Never Used  Substance Use Topics   Alcohol use: Not Currently   Drug use: Not Currently     Allergies   Patient has no known allergies.   Review of Systems Review of Systems  Constitutional:  Negative for fatigue and fever.  HENT:  Positive for dental problem. Negative for facial swelling.   Neurological:  Negative for headaches.  Hematological:  Negative for adenopathy.  Psychiatric/Behavioral:  Positive for sleep disturbance.      Physical Exam Triage Vital Signs ED Triage Vitals  Encounter Vitals Group     BP 07/03/23  1450 (!) 146/95     Systolic BP Percentile --      Diastolic BP Percentile --      Pulse Rate 07/03/23 1450 65     Resp 07/03/23 1450 16     Temp 07/03/23 1450 98.8 F (37.1 C)     Temp Source 07/03/23 1450 Oral     SpO2 07/03/23 1450 96 %     Weight 07/03/23 1450 230 lb (104.3 kg)     Height 07/03/23 1450 5\' 9"  (1.753 m)     Head Circumference --      Peak Flow --      Pain Score 07/03/23 1453 10     Pain Loc --      Pain Education --      Exclude from Growth Chart --    No data found.  Updated Vital Signs BP (!) 146/95 (BP Location: Left Arm)   Pulse 65   Temp 98.8 F (37.1 C) (Oral)   Resp 16   Ht 5\' 9"  (1.753 m)   Wt 230 lb (104.3 kg)   SpO2 96%   BMI 33.97 kg/m       Physical Exam Vitals and nursing note reviewed.  Constitutional:      General: He is not in acute distress.    Appearance: Normal appearance. He is well-developed. He is not ill-appearing.  HENT:  Head: Normocephalic and atraumatic.     Nose: Nose normal.     Mouth/Throat:     Mouth: Mucous membranes are moist.     Dentition: Abnormal dentition. Dental tenderness present.     Pharynx: Oropharynx is clear.   Eyes:     General: No scleral icterus.    Conjunctiva/sclera: Conjunctivae normal.  Cardiovascular:     Rate and Rhythm: Normal rate.  Pulmonary:     Effort: Pulmonary effort is normal. No respiratory distress.  Musculoskeletal:     Cervical back: Neck supple.  Skin:    General: Skin is warm and dry.     Capillary Refill: Capillary refill takes less than 2 seconds.  Neurological:     Mental Status: He is alert.  Psychiatric:        Mood and Affect: Mood normal.        Behavior: Behavior normal.      UC Treatments / Results  Labs (all labs ordered are listed, but only abnormal results are displayed) Labs Reviewed - No data to display  EKG   Radiology No results found.  Procedures Procedures (including critical care time)  Medications Ordered in UC Medications   ketorolac (TORADOL) 30 MG/ML injection 30 mg (30 mg Intramuscular Given 07/03/23 1600)    Initial Impression / Assessment and Plan / UC Course  I have reviewed the triage vital signs and the nursing notes.  Pertinent labs & imaging results that were available during my care of the patient were reviewed by me and considered in my medical decision making (see chart for details).   53 year old male presents for pain of the left lower third molar for the past week.  He is afebrile and overall well-appearing.  No facial swelling.  On exam he has crooked left third molar.  Tooth is tender and there is mild swelling around the tooth.  Explained to patient that his wisdom tooth appears to be growing in awkwardly and this is likely causing him discomfort.  Could also be mild infection.  Will start patient on Augmentin.  He was given a ketorolac injection in clinic and sent Toradol to pharmacy.  Encouraged him to follow-up with a dentist as soon as possible as he will likely need to have the tooth removed.  Reviewed return and ER precautions.  Work note given.   Final Clinical Impressions(s) / UC Diagnoses   Final diagnoses:  Pain, dental  Dental infection     Discharge Instructions      -Possible dental infection but I believe your pain is related to the way the tooth is growing in.  It looks like it is your wisdom tooth.  I think it probably needs to be pulled but you should see a dentist about this.  We gave you a pain relief shot and I sent pills to the pharmacy as well as antibiotics.  You can also take Tylenol and use Orajel. - Follow-up with dentist as soon as possible.    ED Prescriptions     Medication Sig Dispense Auth. Provider   amoxicillin-clavulanate (AUGMENTIN) 875-125 MG tablet Take 1 tablet by mouth every 12 (twelve) hours for 7 days. 14 tablet Eusebio Friendly B, PA-C   ketorolac (TORADOL) 10 MG tablet Take 1 tablet (10 mg total) by mouth every 6 (six) hours as needed. 20  tablet Gareth Morgan      PDMP not reviewed this encounter.   Shirlee Latch, PA-C 07/03/23 [phone]

## 2023-07-03 NOTE — ED Triage Notes (Signed)
Pt c/o lower sided dental pain x1 wk. Has tried IBU w/o relief.

## 2023-07-03 NOTE — Discharge Instructions (Signed)
-  Possible dental infection but I believe your pain is related to the way the tooth is growing in.  It looks like it is your wisdom tooth.  I think it probably needs to be pulled but you should see a dentist about this.  We gave you a pain relief shot and I sent pills to the pharmacy as well as antibiotics.  You can also take Tylenol and use Orajel. - Follow-up with dentist as soon as possible.

## 2024-02-28 IMAGING — CR DG LUMBAR SPINE 2-3V
1 series · 3 of 3 positions shown · non-contrast
Comparison: None.

CLINICAL DATA: Low back pain and bilateral radicular pain. Pain
radiates to the left leg over the last week.

EXAM:
LUMBAR SPINE - 2-3 VIEW

[Series 1: dg lumbar spine 2-3 views · 0.14mm/px · 3 of 3 slices shown]
[im 1/3]
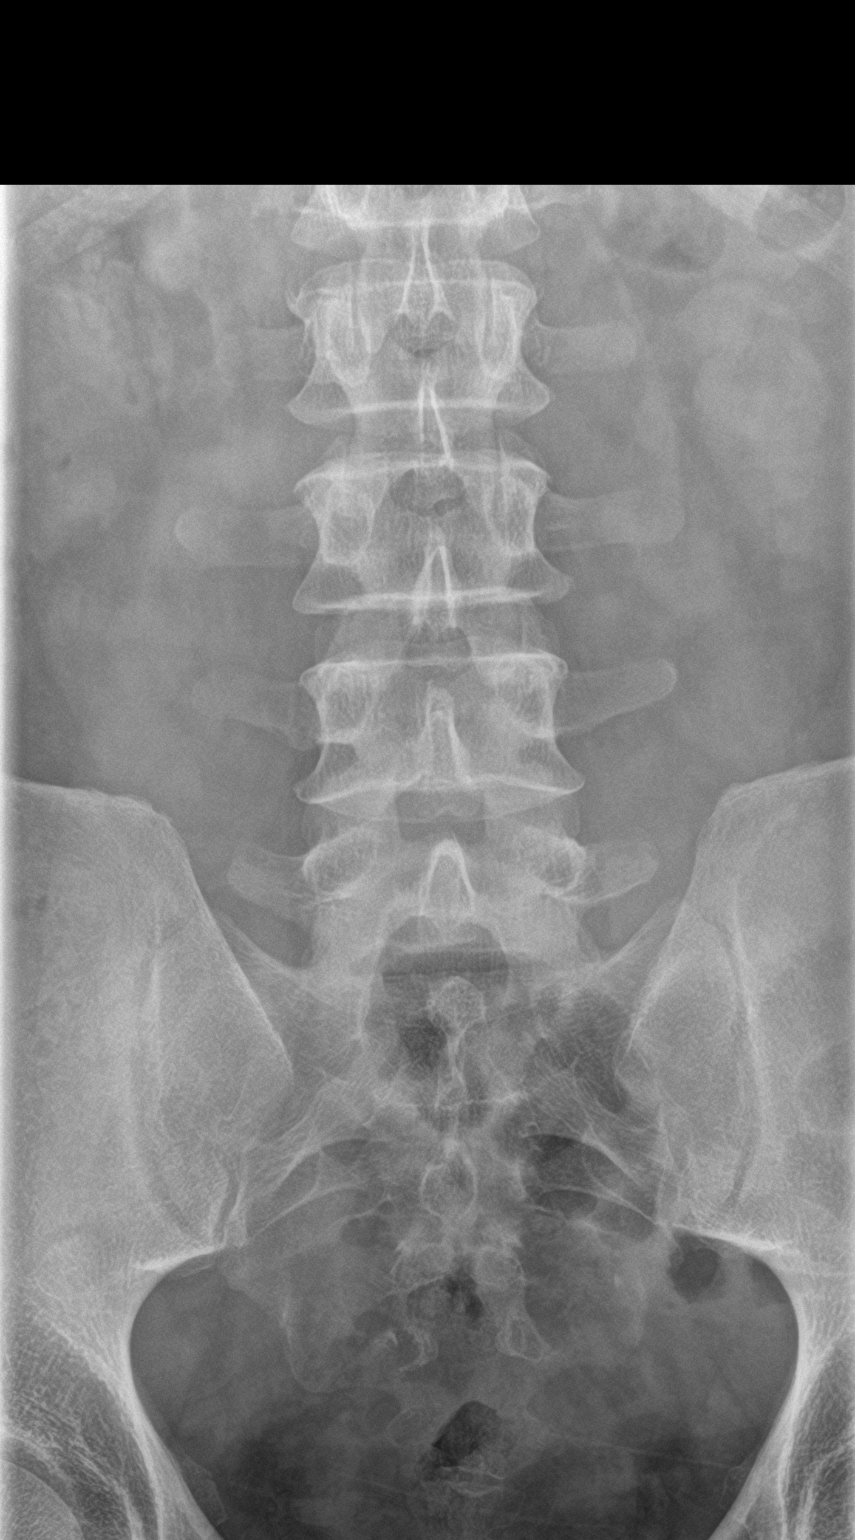
[im 2/3]
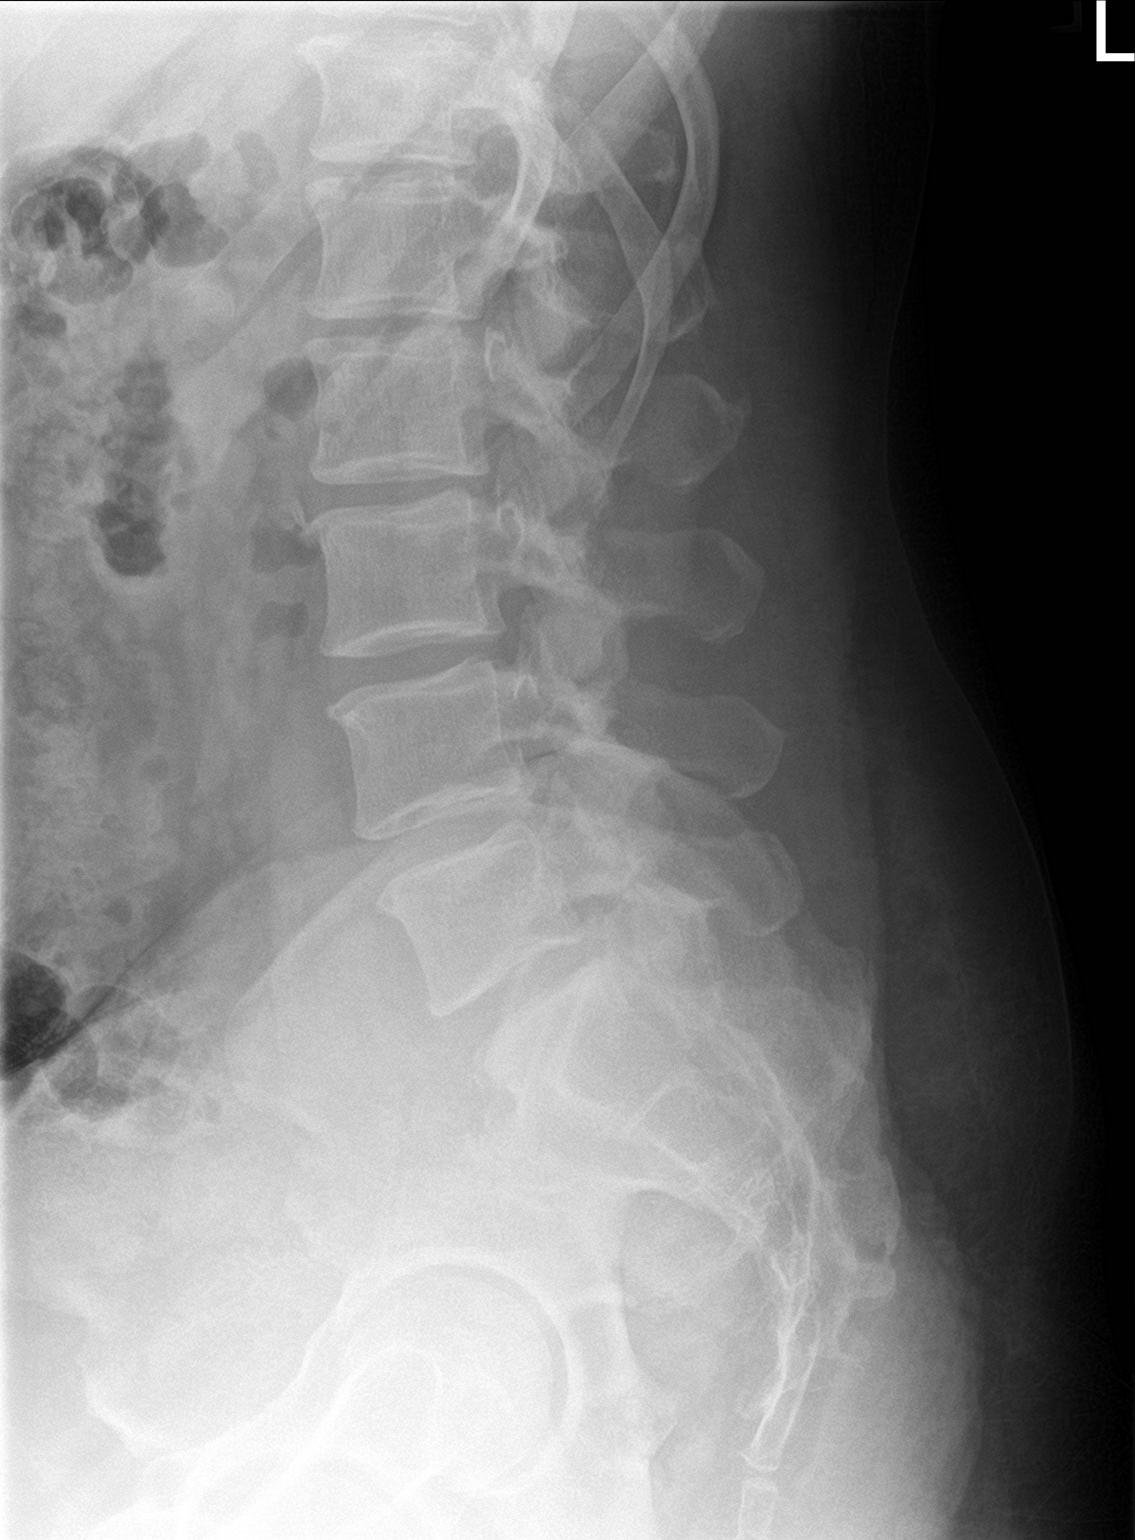
[im 3/3]
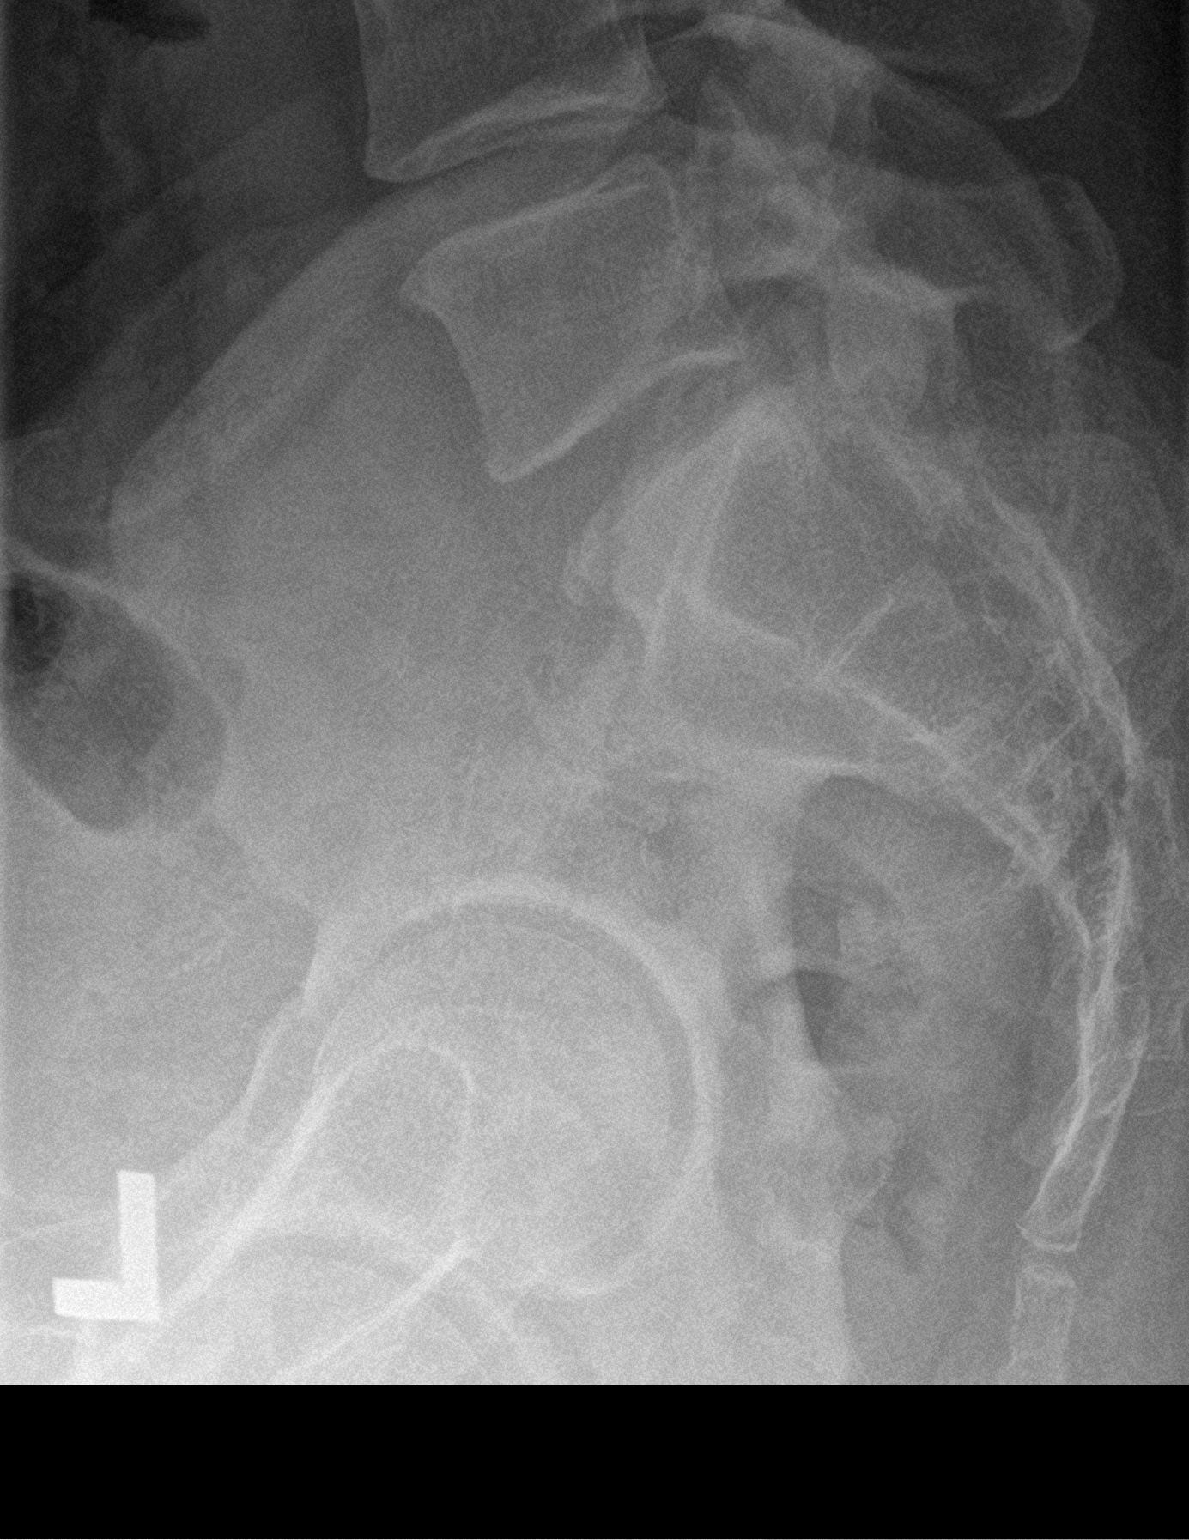

[3 of 3 positions shown; findings below may reference images not displayed]

FINDINGS: Five lumbar type vertebral bodies show normal alignment. Mild disc
space narrowing L5-S1. Mild lower lumbar facet hypertrophy. No
evidence of regional fracture. Sacroiliac joints appear normal.
IMPRESSION: No acute finding. There is only mild disc space narrowing at L5-S1
and mild lower lumbar facet degeneration.
